# Patient Record
Sex: Female | Born: 1980 | Race: Black or African American | Hispanic: No | Marital: Married | Smoking: Former smoker
Health system: Southern US, Community
[De-identification: ages and names within clinical notes are randomized; demographics above are authoritative.]

## PROBLEM LIST (undated history)

## (undated) ENCOUNTER — Emergency Department: Discharge status: Absent without leave | Payer: Self-pay

## (undated) DIAGNOSIS — D649 Anemia, unspecified: Secondary | ICD-10-CM

## (undated) DIAGNOSIS — J45909 Unspecified asthma, uncomplicated: Secondary | ICD-10-CM

## (undated) DIAGNOSIS — T7840XA Allergy, unspecified, initial encounter: Secondary | ICD-10-CM

## (undated) HISTORY — DX: Allergy, unspecified, initial encounter: T78.40XA

## (undated) HISTORY — PX: HAND CARPECTOMY: SHX972

---
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
## 1999-02-23 ENCOUNTER — Inpatient Hospital Stay (HOSPITAL_COMMUNITY): Admission: AD | Admit: 1999-02-23 | Discharge: 1999-02-23 | Payer: Self-pay | Admitting: *Deleted

## 1999-03-19 ENCOUNTER — Inpatient Hospital Stay (HOSPITAL_COMMUNITY): Admission: AD | Admit: 1999-03-19 | Discharge: 1999-03-19 | Payer: Self-pay | Admitting: *Deleted

## 1999-03-27 ENCOUNTER — Inpatient Hospital Stay (HOSPITAL_COMMUNITY): Admission: AD | Admit: 1999-03-27 | Discharge: 1999-03-27 | Payer: Self-pay | Admitting: *Deleted

## 1999-06-26 ENCOUNTER — Inpatient Hospital Stay (HOSPITAL_COMMUNITY): Admission: AD | Admit: 1999-06-26 | Discharge: 1999-06-26 | Payer: Self-pay | Admitting: *Deleted

## 1999-06-30 ENCOUNTER — Inpatient Hospital Stay (HOSPITAL_COMMUNITY): Admission: AD | Admit: 1999-06-30 | Discharge: 1999-07-03 | Payer: Self-pay | Admitting: *Deleted

## 1999-07-26 ENCOUNTER — Inpatient Hospital Stay (HOSPITAL_COMMUNITY): Admission: AD | Admit: 1999-07-26 | Discharge: 1999-07-26 | Payer: Self-pay | Admitting: *Deleted

## 1999-10-22 ENCOUNTER — Inpatient Hospital Stay (HOSPITAL_COMMUNITY): Admission: AD | Admit: 1999-10-22 | Discharge: 1999-10-22 | Payer: Self-pay | Admitting: *Deleted

## 2000-01-24 ENCOUNTER — Inpatient Hospital Stay (HOSPITAL_COMMUNITY): Admission: AD | Admit: 2000-01-24 | Discharge: 2000-01-24 | Payer: Self-pay | Admitting: *Deleted

## 2000-05-07 ENCOUNTER — Inpatient Hospital Stay (HOSPITAL_COMMUNITY): Admission: AD | Admit: 2000-05-07 | Discharge: 2000-05-07 | Payer: Self-pay | Admitting: *Deleted

## 2011-09-04 ENCOUNTER — Other Ambulatory Visit (HOSPITAL_COMMUNITY)
Admission: RE | Admit: 2011-09-04 | Discharge: 2011-09-04 | Disposition: A | Discharge status: Home / Self Care | Payer: Managed Care, Other (non HMO) | Source: Ambulatory Visit | Attending: Obstetrics & Gynecology | Admitting: Obstetrics & Gynecology

## 2011-09-04 ENCOUNTER — Other Ambulatory Visit: Payer: Self-pay | Admitting: Obstetrics & Gynecology

## 2011-09-04 DIAGNOSIS — Z124 Encounter for screening for malignant neoplasm of cervix: Secondary | ICD-10-CM | POA: Insufficient documentation

## 2011-09-04 DIAGNOSIS — Z113 Encounter for screening for infections with a predominantly sexual mode of transmission: Secondary | ICD-10-CM | POA: Insufficient documentation

## 2011-09-04 DIAGNOSIS — Z1159 Encounter for screening for other viral diseases: Secondary | ICD-10-CM | POA: Insufficient documentation

## 2012-01-20 ENCOUNTER — Emergency Department (HOSPITAL_BASED_OUTPATIENT_CLINIC_OR_DEPARTMENT_OTHER): Payer: Managed Care, Other (non HMO)

## 2012-01-20 ENCOUNTER — Emergency Department (HOSPITAL_BASED_OUTPATIENT_CLINIC_OR_DEPARTMENT_OTHER)
Admission: EM | Admit: 2012-01-20 | Discharge: 2012-01-20 | Disposition: A | Discharge status: Home / Self Care | Payer: Managed Care, Other (non HMO) | Attending: Emergency Medicine | Admitting: Emergency Medicine

## 2012-01-20 ENCOUNTER — Encounter (HOSPITAL_BASED_OUTPATIENT_CLINIC_OR_DEPARTMENT_OTHER): Payer: Self-pay | Admitting: Family Medicine

## 2012-01-20 DIAGNOSIS — M79609 Pain in unspecified limb: Secondary | ICD-10-CM | POA: Insufficient documentation

## 2012-01-20 DIAGNOSIS — J45909 Unspecified asthma, uncomplicated: Secondary | ICD-10-CM | POA: Insufficient documentation

## 2012-01-20 DIAGNOSIS — M79671 Pain in right foot: Secondary | ICD-10-CM

## 2012-01-20 HISTORY — DX: Anemia, unspecified: D64.9

## 2012-01-20 HISTORY — DX: Unspecified asthma, uncomplicated: J45.909

## 2012-01-20 MED ORDER — IBUPROFEN 800 MG PO TABS: 800.0000 mg | ORAL_TABLET | Freq: Three times a day (TID) | ORAL | Status: DC

## 2012-01-20 MED ORDER — HYDROCODONE-ACETAMINOPHEN 5-325 MG PO TABS: 2.0000 | ORAL_TABLET | ORAL | Status: AC | PRN

## 2012-01-20 NOTE — ED Notes (Signed)
Pt c/o left foot pain without injury x 3 days. Pt sts pain is on top of foot and radiates to knee. Pain increases with ambulation.

## 2012-01-20 NOTE — ED Provider Notes (Signed)
Medical screening examination/treatment/procedure(s) were performed by non-physician practitioner and as supervising physician I was immediately available for consultation/collaboration.   Cyndra Numbers, MD 01/20/12 1728

## 2012-01-20 NOTE — ED Provider Notes (Signed)
History     CSN: [id number]  Arrival date & time 01/20/12  1478   First MD Initiated Contact with Patient 01/20/12 1215      Chief Complaint  Patient presents with  . Foot Pain    (Consider location/radiation/quality/duration/timing/severity/associated sxs/prior treatment) Patient is a 31 y.o. female presenting with lower extremity pain. The history is provided by the patient. No language interpreter was used.  Foot Pain This is a new problem. The current episode started today. The problem occurs constantly. The problem has been gradually worsening. Associated symptoms include joint swelling. Pertinent negatives include no abdominal pain. Nothing aggravates the symptoms. She has tried nothing for the symptoms.   Pt denies any injury Past Medical History  Diagnosis Date  . Asthma   . Anemia     Past Surgical History  Procedure Date  . Cesarean section     No family history on file.  History  Substance Use Topics  . Smoking status: Never Smoker   . Smokeless tobacco: Not on file  . Alcohol Use: No    OB History    Grav Para Term Preterm Abortions TAB SAB Ect Mult Living                  Review of Systems  Unable to perform ROS Gastrointestinal: Negative for abdominal pain.  Musculoskeletal: Positive for joint swelling.  All other systems reviewed and are negative.    Allergies  Review of patient's allergies indicates no known allergies.  Home Medications  No current outpatient prescriptions on file.  BP 111/64  Pulse 85  Temp 98.6 F (37 C) (Oral)  Resp 16  Ht 5\' 1"  (1.549 m)  Wt 156 lb (70.761 kg)  BMI 29.48 kg/m2  SpO2 100%  LMP 12/28/2011  Physical Exam  Nursing note and vitals reviewed. Constitutional: She appears well-developed and well-nourished.  HENT:  Head: Normocephalic and atraumatic.  Musculoskeletal: She exhibits tenderness.       Left foot nonswollen,  From,  nv and ns intact tender top of mid foot to touch  Neurological: She  is alert.  Skin: Skin is warm and dry.  Psychiatric: She has a normal mood and affect.    ED Course  Procedures (including critical care time)  Labs Reviewed - No data to display Dg Tibia/fibula Left  01/20/2012  *RADIOLOGY REPORT*  Clinical Data: Left-sided foot pain which radiates to the lower leg  LEFT TIBIA AND FIBULA - 2 VIEW  Comparison: Left foot radiographs - earlier same day  Findings:  No fracture or dislocation.  There is minimal enthesopathic change of the posterior medial aspect of the proximal tibial metaphysis. Regional soft tissues are normal.  No radiopaque foreign body. Limited visualization of the adjacent knee is normal.  IMPRESSION: No explanation for patient's lower leg pain.   Original Report Authenticated By: Waynard Reeds, M.D.    Dg Foot Complete Left  01/20/2012  *RADIOLOGY REPORT*  Clinical Data: Left-sided foot pain which radiates to lower leg, no known injury  LEFT FOOT - COMPLETE 3+ VIEW  Comparison: Left tibia and fibula radiographs - earlier same day  Findings: No fracture or dislocation.  Joint spaces are preserved. No definite erosions.  Regional soft tissues are normal.  No definite ankle joint effusion.  IMPRESSION: Normal radiographs of the left foot.   Original Report Authenticated By: Waynard Reeds, M.D.      No diagnosis found.    MDM  jone dressing post op  shoe        [name] [place], [place] 01/20/12 1243

## 2012-01-23 ENCOUNTER — Ambulatory Visit (INDEPENDENT_AMBULATORY_CARE_PROVIDER_SITE_OTHER): Payer: Managed Care, Other (non HMO) | Admitting: Family Medicine

## 2012-01-23 ENCOUNTER — Encounter: Payer: Self-pay | Admitting: Family Medicine

## 2012-01-23 VITALS — BP 106/72 | HR 85 | Ht 61.0 in | Wt 156.0 lb

## 2012-01-23 DIAGNOSIS — M79609 Pain in unspecified limb: Secondary | ICD-10-CM

## 2012-01-23 DIAGNOSIS — M79671 Pain in right foot: Secondary | ICD-10-CM

## 2012-01-23 NOTE — Patient Instructions (Addendum)
You have an extensor tendinopathy of your right foot. Cam walker when up and walking around to help rest this for next 7-10 days. Ok to come out of this twice a day for simple up/down and alphabet motion exercises. Ice 15 minutes at a time 3-4 times a day. Ibuprofen 800mg  three times a day with food for pain and inflammation. This should resolve over the next 2-3 weeks. Follow up with me in 3 weeks or as needed.

## 2012-01-27 ENCOUNTER — Encounter: Payer: Self-pay | Admitting: Family Medicine

## 2012-01-27 DIAGNOSIS — M79671 Pain in right foot: Secondary | ICD-10-CM | POA: Insufficient documentation

## 2012-01-27 NOTE — Assessment & Plan Note (Signed)
radiographs and ultrasound reassuring.  Also did not have an acute injury or repetitive load to suggest occult fracture or stress fracture.  No redness, warmth, prior history to suggest gout.  Consistent with a severe extensor tendinopathy of right foot.  Cam walker to rest area.  Icing, ibuprofen.  F/u in 3 weeks or prn.  If not improving consider MRI.

## 2012-01-27 NOTE — Progress Notes (Signed)
  Subjective:    Patient ID: Debbie Cantrell, female    DOB: Dec 03, 1980, 31 y.o.   MRN: [id number]  PCP: Dr. Neva Seat  HPI 31 yo F here for right foot pain.  Patient denies known injury. States pain started when she ran from car to the house on 9/28 - about 30 minutes later developed dorsal right foot pain. Some swelling but no bruising. No prior right foot injuries. Went to ED and had normal x-rays. Placed in bulky dressing and postop shoe. Has been taking ibuprofen, vicodin as needed. Does not run regularly for exercise.  Past Medical History  Diagnosis Date  . Asthma   . Anemia   . Allergy     Current Outpatient Prescriptions on File Prior to Visit  Medication Sig Dispense Refill  . HYDROcodone-acetaminophen (NORCO/VICODIN) 5-325 MG per tablet Take 2 tablets by mouth every 4 (four) hours as needed for pain.  16 tablet  0  . ibuprofen (ADVIL,MOTRIN) 800 MG tablet Take 1 tablet (800 mg total) by mouth 3 (three) times daily.  21 tablet  0    Past Surgical History  Procedure Date  . Cesarean section     No Known Allergies  History   Social History  . Marital Status: Single    Spouse Name: N/A    Number of Children: N/A  . Years of Education: N/A   Occupational History  . Not on file.   Social History Main Topics  . Smoking status: Never Smoker   . Smokeless tobacco: Not on file  . Alcohol Use: No  . Drug Use: No  . Sexually Active: Not on file   Other Topics Concern  . Not on file   Social History Narrative  . No narrative on file    Family History  Problem Relation Age of Onset  . Hypertension Father   . Hyperlipidemia Neg Hx   . Heart attack Neg Hx   . Diabetes Neg Hx   . Sudden death Neg Hx     BP 106/72  Pulse 85  Ht 5\' 1"  (1.549 m)  Wt 156 lb (70.761 kg)  BMI 29.48 kg/m2  LMP 12/28/2011  Review of Systems See HPI above.    Objective:   Physical Exam Gen: NAD  R foot/ankle: No gross deformity, swelling, ecchymoses. FROM ankle  with 5/5 strength all directions. TTP extensor tendons as they cross ankle, greatest across 2nd metatarsal.  No navicular, base 5th, malleolar, other TTP about foot or ankle. Negative ant drawer and talar tilt.   Negative syndesmotic compression. Thompsons test negative. NV intact distally.  MSK u/s:  No evidence cortical irregularity, neovascularity, edema overlying bony cortex of right foot.    Assessment & Plan:  1. Right foot pain - radiographs and ultrasound reassuring.  Also did not have an acute injury or repetitive load to suggest occult fracture or stress fracture.  No redness, warmth, prior history to suggest gout.  Consistent with a severe extensor tendinopathy of right foot.  Cam walker to rest area.  Icing, ibuprofen.  F/u in 3 weeks or prn.  If not improving consider MRI.

## 2015-09-08 ENCOUNTER — Emergency Department (INDEPENDENT_AMBULATORY_CARE_PROVIDER_SITE_OTHER): Payer: Managed Care, Other (non HMO)

## 2015-09-08 ENCOUNTER — Emergency Department (INDEPENDENT_AMBULATORY_CARE_PROVIDER_SITE_OTHER)
Admission: EM | Admit: 2015-09-08 | Discharge: 2015-09-08 | Disposition: A | Discharge status: Home / Self Care | Payer: Worker's Compensation | Source: Home / Self Care | Attending: Family Medicine | Admitting: Family Medicine

## 2015-09-08 DIAGNOSIS — M79641 Pain in right hand: Secondary | ICD-10-CM

## 2015-09-08 DIAGNOSIS — M25531 Pain in right wrist: Secondary | ICD-10-CM

## 2015-09-08 DIAGNOSIS — Z8669 Personal history of other diseases of the nervous system and sense organs: Secondary | ICD-10-CM | POA: Diagnosis not present

## 2015-09-08 NOTE — ED Notes (Signed)
Splint to right hand.  Notified Otilio Saberanyelle Woodard regarding treatment and limitations.

## 2015-09-08 NOTE — Discharge Instructions (Signed)
You may continue to take acetaminophen and ibuprofen for pain.  Be sure to wear your wrist splint while working to help alleviate pain.  See below for further instructions.

## 2015-09-08 NOTE — ED Notes (Signed)
Pt stated she was at work Monday morning lifting boxes and started having severe pain in the right hand.  She states that it is not continuos, but comes and goes with lifting pulling and pushing.  Pt also stated that it was swollen this am.  She has taken ibuprofen for the pain.  When hurting pain level is an 8.

## 2015-09-08 NOTE — ED Provider Notes (Signed)
CSN: [id number]     Arrival date & time 09/08/15  1004 History   First MD Initiated Contact with Patient 09/08/15 1035     Chief Complaint  Patient presents with  . Hand Pain    right   (Consider location/radiation/quality/duration/timing/severity/associated sxs/prior Treatment) HPI The pt is a 35yo female [facility] with c/o Right hand and wrist pain for about 5 days. Pain is aching and sore, severe at times. She first noticed it while lifting boxes at work.  Pain is intermittent but 8/10 at worst, worse with lifting, pulling and pushing. She is Right hand dominant. She had carpal tunnel surgery performed on her Right wrist 2 years ago by Dr. Mina MarbleWeingold and has not had any issues since this last Monday.  She has worn a wrist splint a few times and taken ibuprofen but no relief of pain. Denies known injury.  Past Medical History  Diagnosis Date  . Asthma   . Anemia   . Allergy    Past Surgical History  Procedure Laterality Date  . Cesarean section    . Hand carpectomy      right in 2015 with torn tendon   Family History  Problem Relation Age of Onset  . Hypertension Father   . Diabetes Father   . Hyperlipidemia Neg Hx   . Heart attack Neg Hx   . Sudden death Neg Hx   . Hypertension Mother   . Hypertension Brother    Social History  Substance Use Topics  . Smoking status: Never Smoker   . Smokeless tobacco: None  . Alcohol Use: No   OB History    No data available     Review of Systems  Musculoskeletal: Positive for myalgias, joint swelling and arthralgias.       Right wrist and hand  Skin: Negative for color change, rash and wound.  Neurological: Positive for weakness and numbness.    Allergies  Review of patient's allergies indicates no known allergies.  Home Medications   Prior to Admission medications   Medication Sig Start Date End Date Taking? Authorizing Provider  ibuprofen (ADVIL,MOTRIN) 800 MG tablet Take 1 tablet (800 mg total) by mouth 3  (three) times daily. 01/20/12   Elson AreasLeslie K Sofia, PA-C   Meds Ordered and Administered this Visit  Medications - No data to display  BP 105/71 mmHg  Pulse 77  Temp(Src) 98.6 F (37 C) (Oral)  Ht 5\' 2"  (1.575 m)  Wt 169 lb 12 oz (76.998 kg)  BMI 31.04 kg/m2  SpO2 100%  LMP 08/19/2015 No data found.   Physical Exam  Constitutional: She is oriented to person, place, and time. She appears well-developed and well-nourished.  HENT:  Head: Normocephalic and atraumatic.  Eyes: EOM are normal.  Neck: Normal range of motion.  Cardiovascular: Normal rate.   Pulses:      Radial pulses are 2+ on the right side.  Pulmonary/Chest: Effort normal.  Musculoskeletal: Normal range of motion. She exhibits tenderness. She exhibits no edema.  Right wrist and hand: no deformity or edema. Full ROM. 4/5 grip strength compared to Left hand. No tenderness of Right elbow, full ROM.  Neurological: She is alert and oriented to person, place, and time.  Altered sensation in fingers 2-4 on Right hand.   Skin: Skin is warm and dry.  Right wrist and hand: skin in tact. No ecchymosis or erythema. Well healed surgical scars.   Psychiatric: She has a normal mood and affect. Her behavior is  normal.  Nursing note and vitals reviewed.   ED Course  Procedures (including critical care time)  Labs Review Labs Reviewed - No data to display  Imaging Review Dg Wrist Complete Right  09/08/2015  CLINICAL DATA:  Right hand and wrist pain after lifting boxes 4 days ago. Swelling this morning. Most pain at the first through fourth MCP joints. Initial encounter. EXAM: RIGHT WRIST - COMPLETE 3+ VIEW COMPARISON:  None. FINDINGS: There is no evidence of fracture or dislocation. There is no evidence of arthropathy or other focal bone abnormality. Soft tissues are unremarkable. IMPRESSION: Negative. Electronically Signed   By: Sebastian Ache M.D.   On: 09/08/2015 11:31   Dg Hand Complete Right  09/08/2015  CLINICAL DATA:  Right  hand and wrist pain after lifting boxes 4 days ago. Swelling this morning. Most pain at the first through fourth MCP joints. Initial encounter. EXAM: RIGHT HAND - COMPLETE 3+ VIEW COMPARISON:  None. FINDINGS: There is no evidence of fracture or dislocation. There is no evidence of arthropathy or other focal bone abnormality. Soft tissues are unremarkable. IMPRESSION: Negative. Electronically Signed   By: Sebastian Ache M.D.   On: 09/08/2015 11:32      MDM   1. Right hand pain   2. Right wrist pain   3. History of carpal tunnel syndrome    Pt c/o Right hand and wrist pain. No known injury. Hx of carpal tunnel surgery 2 years ago. PMS in tact. Plain films: negative for acute injury.  Reassured pt of normal imaging. Encouraged rest, ice, compression and elevation. OTC pain medication as needed. Request pt perform light duty for 1 week. Limit pushing, pulling, lifting, carrying with Right hand. No lifting over 5 pounds with Right hand. May wear wrist splint at work. New wrist splint provided in UC.  F/u with her hand surgeon, Dr. Mina Marble, or Sports Medicine in 1-2 weeks if not improving.    Junius Finner, PA-C 09/08/15 1232

## 2017-01-03 ENCOUNTER — Encounter (HOSPITAL_BASED_OUTPATIENT_CLINIC_OR_DEPARTMENT_OTHER): Payer: Self-pay

## 2017-01-03 ENCOUNTER — Emergency Department (HOSPITAL_BASED_OUTPATIENT_CLINIC_OR_DEPARTMENT_OTHER)
Admission: EM | Admit: 2017-01-03 | Discharge: 2017-01-03 | Disposition: A | Discharge status: Home / Self Care | Payer: 59 | Attending: Emergency Medicine | Admitting: Emergency Medicine

## 2017-01-03 DIAGNOSIS — B373 Candidiasis of vulva and vagina: Secondary | ICD-10-CM | POA: Insufficient documentation

## 2017-01-03 DIAGNOSIS — J45909 Unspecified asthma, uncomplicated: Secondary | ICD-10-CM | POA: Diagnosis not present

## 2017-01-03 DIAGNOSIS — N898 Other specified noninflammatory disorders of vagina: Secondary | ICD-10-CM | POA: Diagnosis present

## 2017-01-03 DIAGNOSIS — B3731 Acute candidiasis of vulva and vagina: Secondary | ICD-10-CM

## 2017-01-03 LAB — URINALYSIS, ROUTINE W REFLEX MICROSCOPIC
Bilirubin Urine: NEGATIVE
GLUCOSE, UA: NEGATIVE mg/dL
Hgb urine dipstick: NEGATIVE
Ketones, ur: NEGATIVE mg/dL
Nitrite: NEGATIVE
PH: 6 (ref 5.0–8.0)
Protein, ur: NEGATIVE mg/dL
Specific Gravity, Urine: 1.02 (ref 1.005–1.030)

## 2017-01-03 LAB — URINALYSIS, MICROSCOPIC (REFLEX): RBC / HPF: NONE SEEN RBC/hpf (ref 0–5)

## 2017-01-03 LAB — WET PREP, GENITAL
SPERM: NONE SEEN
TRICH WET PREP: NONE SEEN

## 2017-01-03 LAB — PREGNANCY, URINE: Preg Test, Ur: NEGATIVE

## 2017-01-03 MED ORDER — FLUCONAZOLE 150 MG PO TABS: 150.0000 mg | ORAL_TABLET | Freq: Every day | ORAL | 0 refills | Status: DC

## 2017-01-03 MED ORDER — FLUCONAZOLE 50 MG PO TABS
150.0000 mg | ORAL_TABLET | Freq: Once | ORAL | Status: AC
  Administered 2017-01-03: 150 mg via ORAL
  Filled 2017-01-03: qty 1

## 2017-01-03 NOTE — ED Triage Notes (Signed)
C/o vaginal d/c x 2 days-NAD-steady gait 

## 2017-01-03 NOTE — ED Provider Notes (Signed)
[facility] Note   CSN: [id number] Arrival date & time: 01/03/17  1548     History   Chief Complaint Chief Complaint  Patient presents with  . Vaginal Discharge    HPI Debbie Cantrell is a 36 y.o. female presenting with vaginal discharge that is thick and white. She has irritation around her labia and itching. It burns after she urinates. She denies fevers, chills, pelvic pain, nausea, vomiting. She reports getting frequent yeast infections about every 3 months. Usually will use OTC medication to treat. She is monogamous, married. Has her tubes tied. She has no primary care provider.   HPI  Past Medical History:  Diagnosis Date  . Allergy   . Anemia   . Asthma     Patient Active Problem List   Diagnosis Date Noted  . Right foot pain 01/27/2012    Past Surgical History:  Procedure Laterality Date  . CESAREAN SECTION    . HAND CARPECTOMY     right in 2015 with torn tendon    OB History    No data available       Home Medications    Prior to Admission medications   Medication Sig Start Date End Date Taking? Authorizing Provider  fluconazole (DIFLUCAN) 150 MG tablet Take 1 tablet (150 mg total) by mouth daily. 01/03/17   Tillman Sers, DO    Family History Family History  Problem Relation Age of Onset  . Hypertension Father   . Diabetes Father   . Hypertension Mother   . Hypertension Brother   . Hyperlipidemia Neg Hx   . Heart attack Neg Hx   . Sudden death Neg Hx     Social History Social History  Substance Use Topics  . Smoking status: Never Smoker  . Smokeless tobacco: Never Used  . Alcohol use No     Allergies   Patient has no known allergies.   Review of Systems Review of Systems  Constitutional: Negative for activity change, appetite change, chills and fever.  HENT: Negative for congestion, sinus pain and sore throat.   Respiratory: Negative for cough and shortness of breath.   Cardiovascular: Negative for  chest pain.  Gastrointestinal: Negative for abdominal pain, constipation, diarrhea, nausea and vomiting.  Genitourinary: Positive for vaginal discharge and vaginal pain. Negative for decreased urine volume, difficulty urinating, dyspareunia, dysuria, frequency, hematuria, pelvic pain and urgency.  Skin: Negative for rash.  Neurological: Negative for weakness and headaches.     Physical Exam Updated Vital Signs BP 113/65 (BP Location: Left Arm)   Pulse 70   Temp 98.5 F (36.9 C) (Oral)   Resp 18   Ht  (1.575 m)   Wt 75.4 kg (166 lb 3.6 oz)   LMP 12/07/2016   SpO2 100%   BMI 30.40 kg/m   Physical Exam  Constitutional: She appears well-developed and well-nourished. No distress.  HENT:  Head: Normocephalic and atraumatic.  Neck: Normal range of motion.  Cardiovascular: Normal rate and regular rhythm.   No murmur heard. Pulmonary/Chest: Effort normal and breath sounds normal. No respiratory distress.  Abdominal: Soft. She exhibits no mass. There is no tenderness.  Genitourinary: Cervix exhibits no motion tenderness. There is erythema in the vagina. No bleeding in the vagina. Vaginal discharge found.  Musculoskeletal: Normal range of motion. She exhibits no deformity.  Neurological: She is alert. She exhibits normal muscle tone.  Skin: Skin is warm and dry. No rash noted.  Psychiatric: She has a normal  mood and affect.     ED Treatments / Results  Labs (all labs ordered are listed, but only abnormal results are displayed) Labs Reviewed  WET PREP, GENITAL - Abnormal; Notable for the following:       Result Value   Yeast Wet Prep HPF POC PRESENT (*)    Clue Cells Wet Prep HPF POC PRESENT (*)    WBC, Wet Prep HPF POC FEW (*)    All other components within normal limits  URINALYSIS, ROUTINE W REFLEX MICROSCOPIC - Abnormal; Notable for the following:    Leukocytes, UA TRACE (*)    All other components within normal limits  URINALYSIS, MICROSCOPIC (REFLEX) - Abnormal;  Notable for the following:    Bacteria, UA RARE (*)    Squamous Epithelial / LPF 0-5 (*)    All other components within normal limits  PREGNANCY, URINE  GC/CHLAMYDIA PROBE AMP ([facility]) NOT AT [facility]    EKG  EKG Interpretation None       Radiology No results found.  Procedures Procedures (including critical care time)  Medications Ordered in ED Medications  fluconazole (DIFLUCAN) tablet 150 mg (not administered)     Initial Impression / Assessment and Plan / ED Course  I have reviewed the triage vital signs and the nursing notes.  Pertinent labs & imaging results that were available during my care of the patient were reviewed by me and considered in my medical decision making (see chart for details).     35 year old female presenting with vaginal discharge. Upreg negative, UA unremarkable. Wet prep demonstrates yeast and clue cells. Patient with recurrent yeast infections. Treated with  fluconazole in ED and rx given for repeat dose in 3 days if not improved. Recommend outpatient follow up for further treatment of recurrent infections. Patient verbalized understanding and agreement with plan.   Final Clinical Impressions(s) / ED Diagnoses   Final diagnoses:  Vaginal yeast infection    New Prescriptions New Prescriptions   FLUCONAZOLE (DIFLUCAN) 150 MG TABLET    Take 1 tablet (150 mg total) by mouth daily.   Debbie Patty, DO PGY-2, Debbie Cantrell 01/03/2017 4:39 PM    Tillman Sers, DO 01/03/17 1640    Pricilla Loveless, MD 01/03/17 [phone]

## 2017-01-06 LAB — GC/CHLAMYDIA PROBE AMP (~~LOC~~) NOT AT ARMC
Chlamydia: NEGATIVE
NEISSERIA GONORRHEA: NEGATIVE

## 2017-04-07 IMAGING — DX DG WRIST COMPLETE 3+V*R*
4 series · 4 of 4 positions shown · non-contrast
Comparison: None.

CLINICAL DATA: Right hand and wrist pain after lifting boxes 4 days
ago. Swelling this morning. Most pain at the first through fourth
MCP joints. Initial encounter.

EXAM:
RIGHT WRIST - COMPLETE 3+ VIEW

[wrist pa]
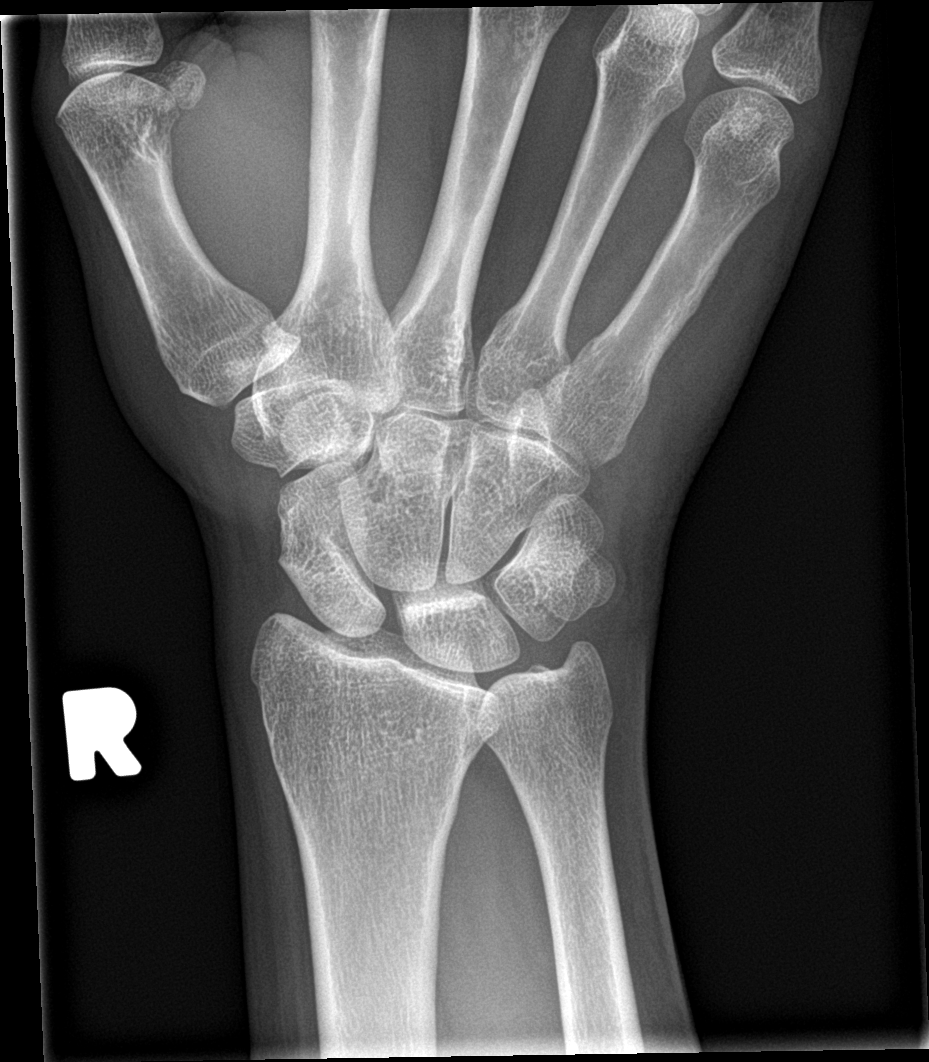

[wrist obl]
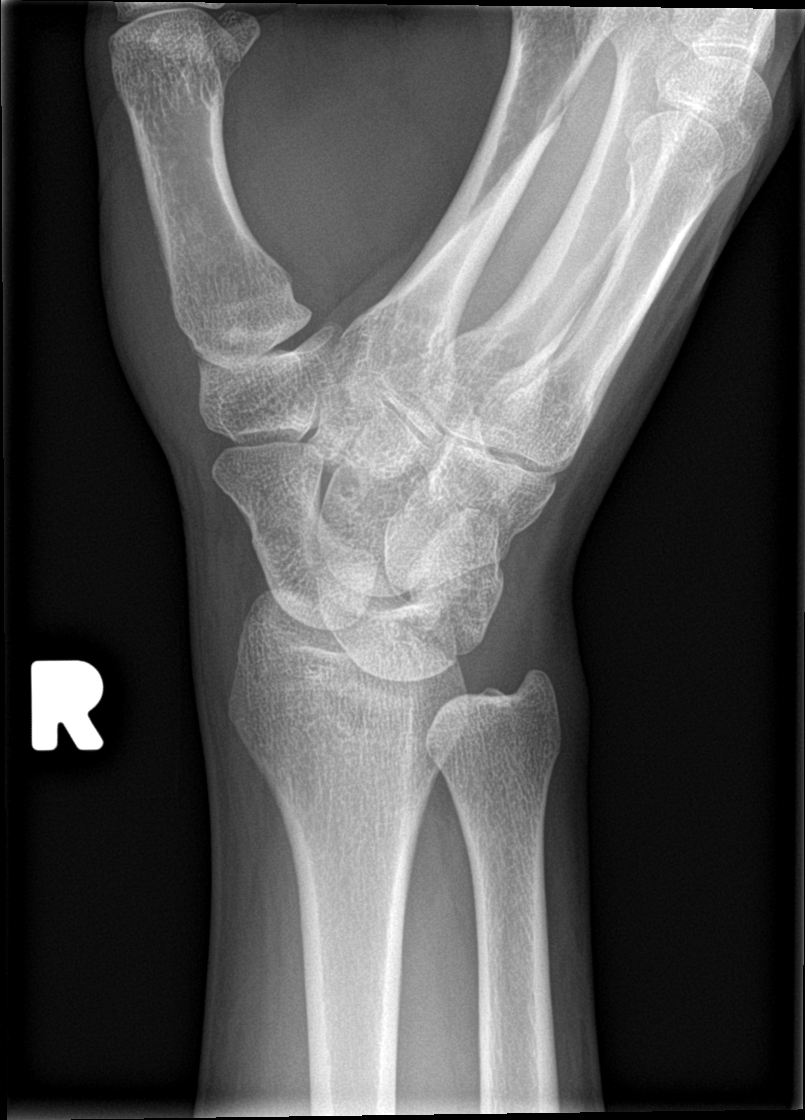

[wrist lat]
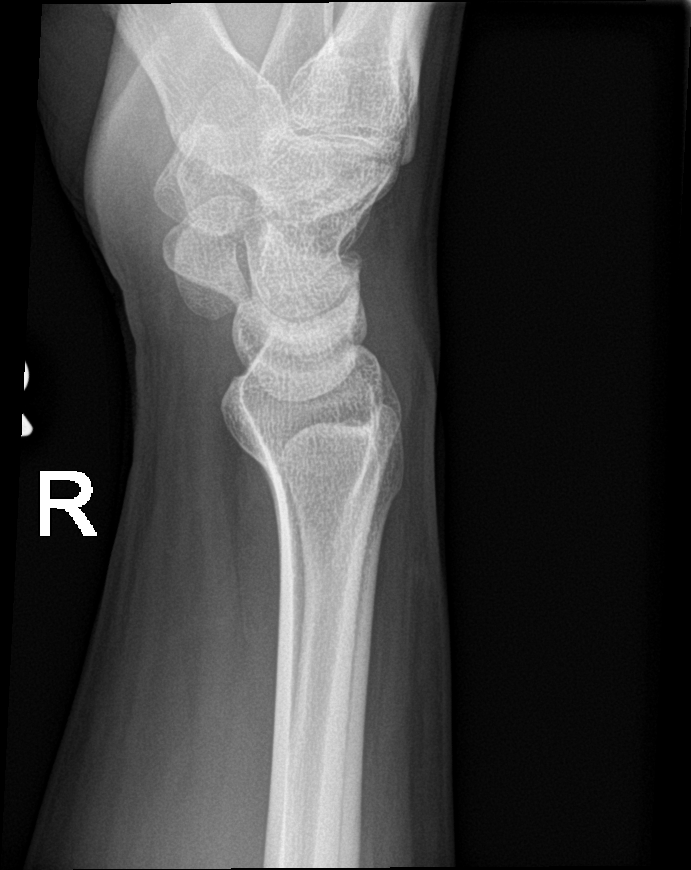

[wrist navicular]
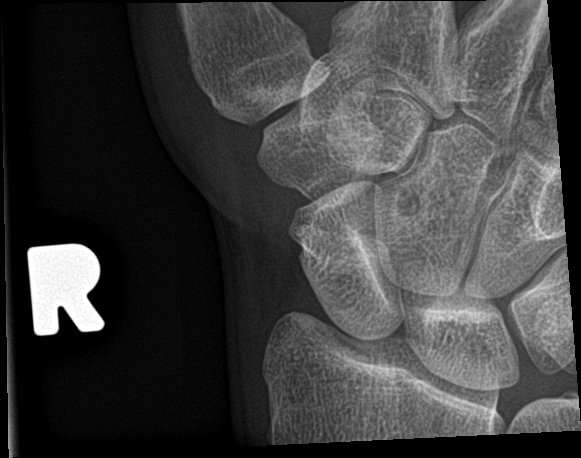

[4 of 4 positions shown; findings below may reference images not displayed]

FINDINGS: There is no evidence of fracture or dislocation. There is no
evidence of arthropathy or other focal bone abnormality. Soft
tissues are unremarkable.
IMPRESSION: Negative.

## 2017-04-07 IMAGING — DX DG HAND COMPLETE 3+V*R*
3 series · 3 of 3 positions shown · non-contrast
Comparison: None.

CLINICAL DATA: Right hand and wrist pain after lifting boxes 4 days
ago. Swelling this morning. Most pain at the first through fourth
MCP joints. Initial encounter.

EXAM:
RIGHT HAND - COMPLETE 3+ VIEW

[hand pa]
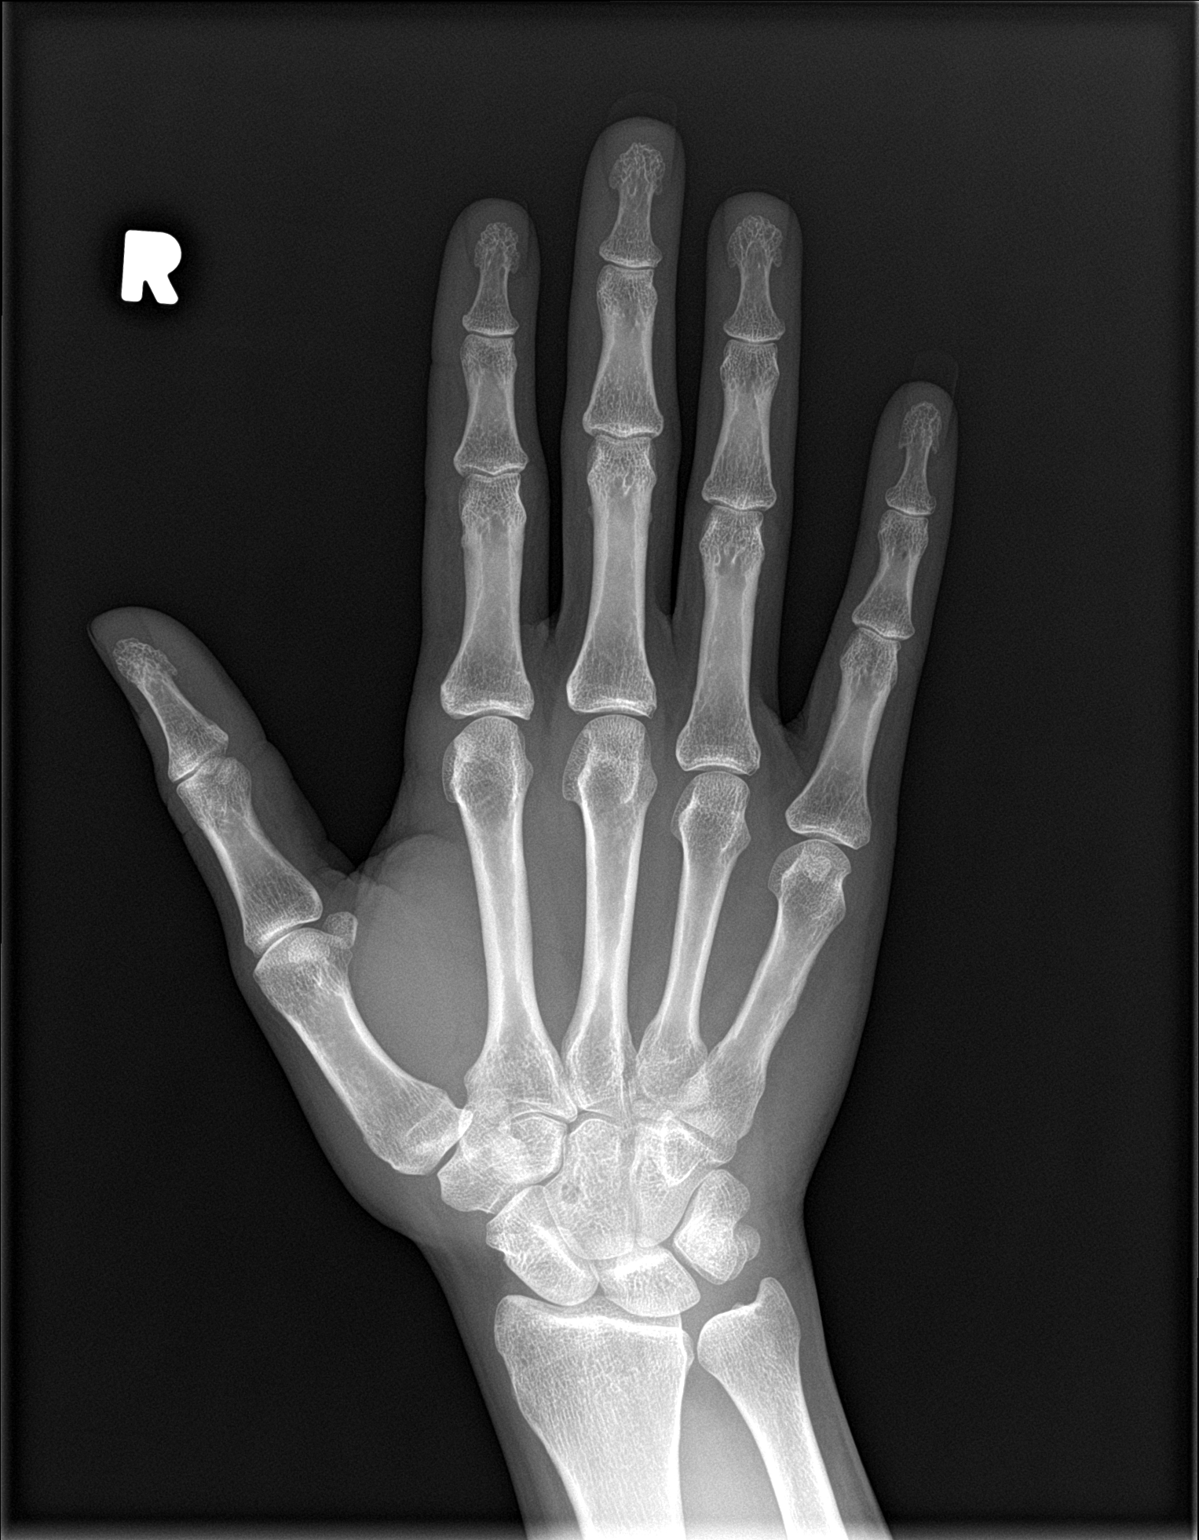

[hand obl]
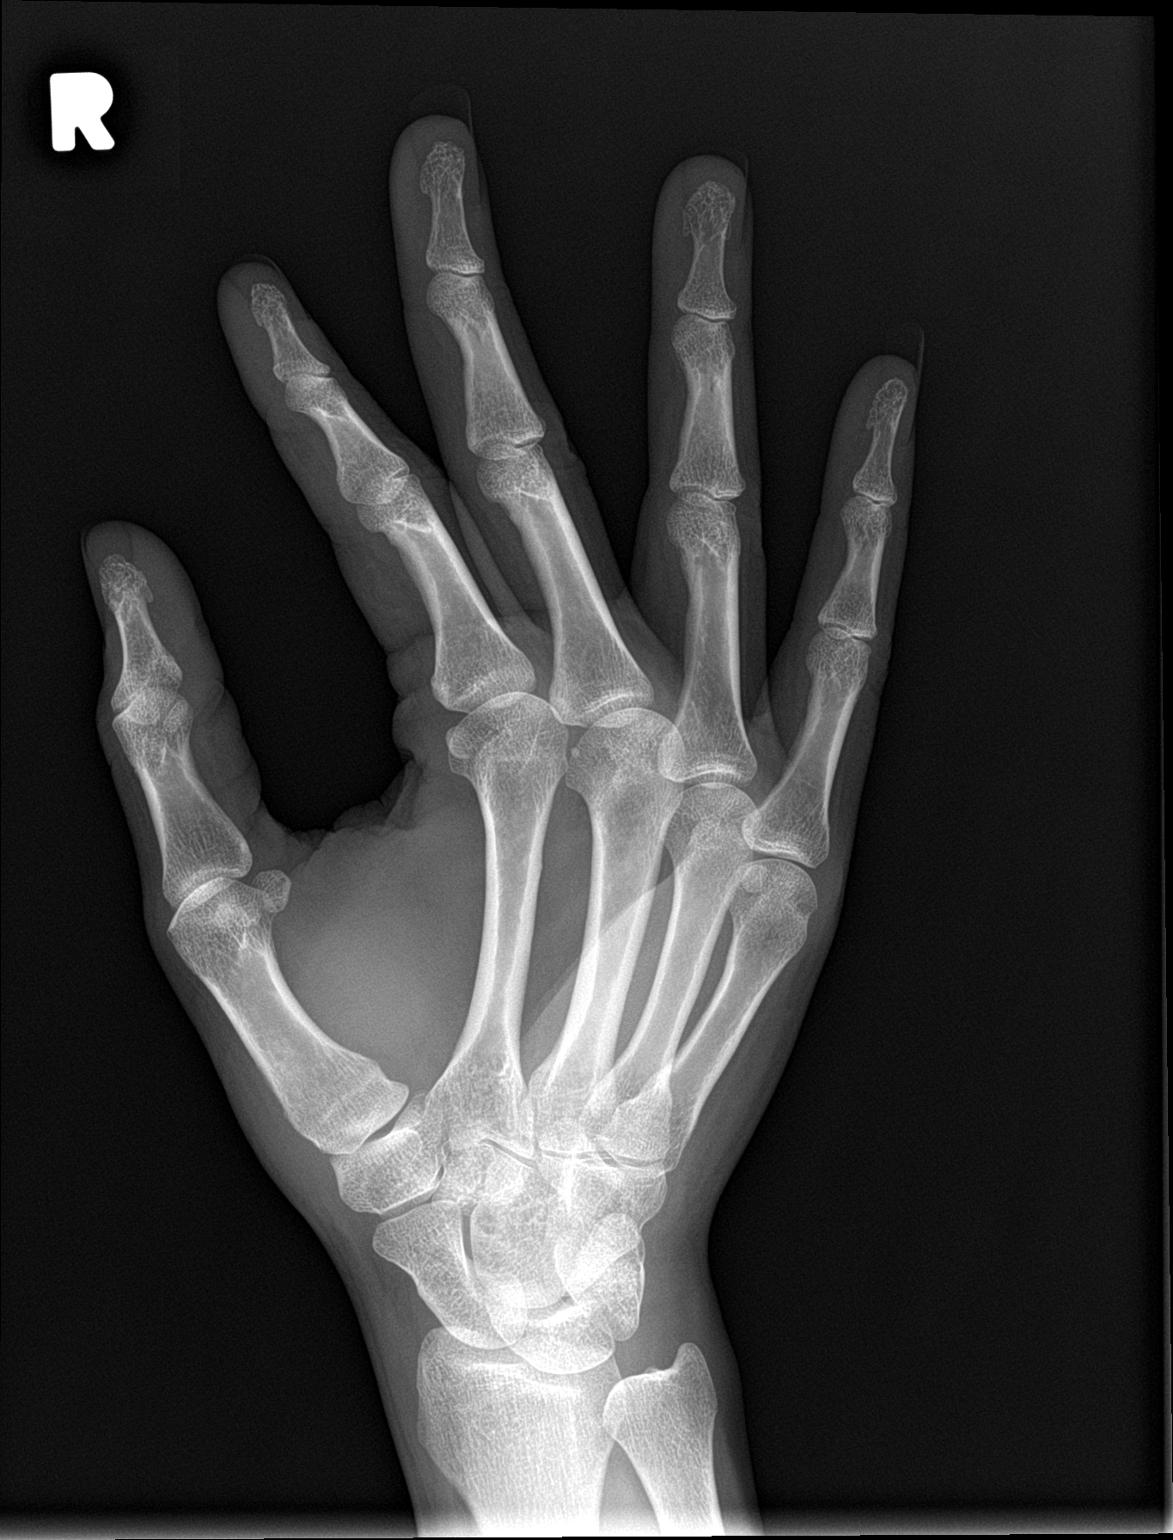

[hand lat]
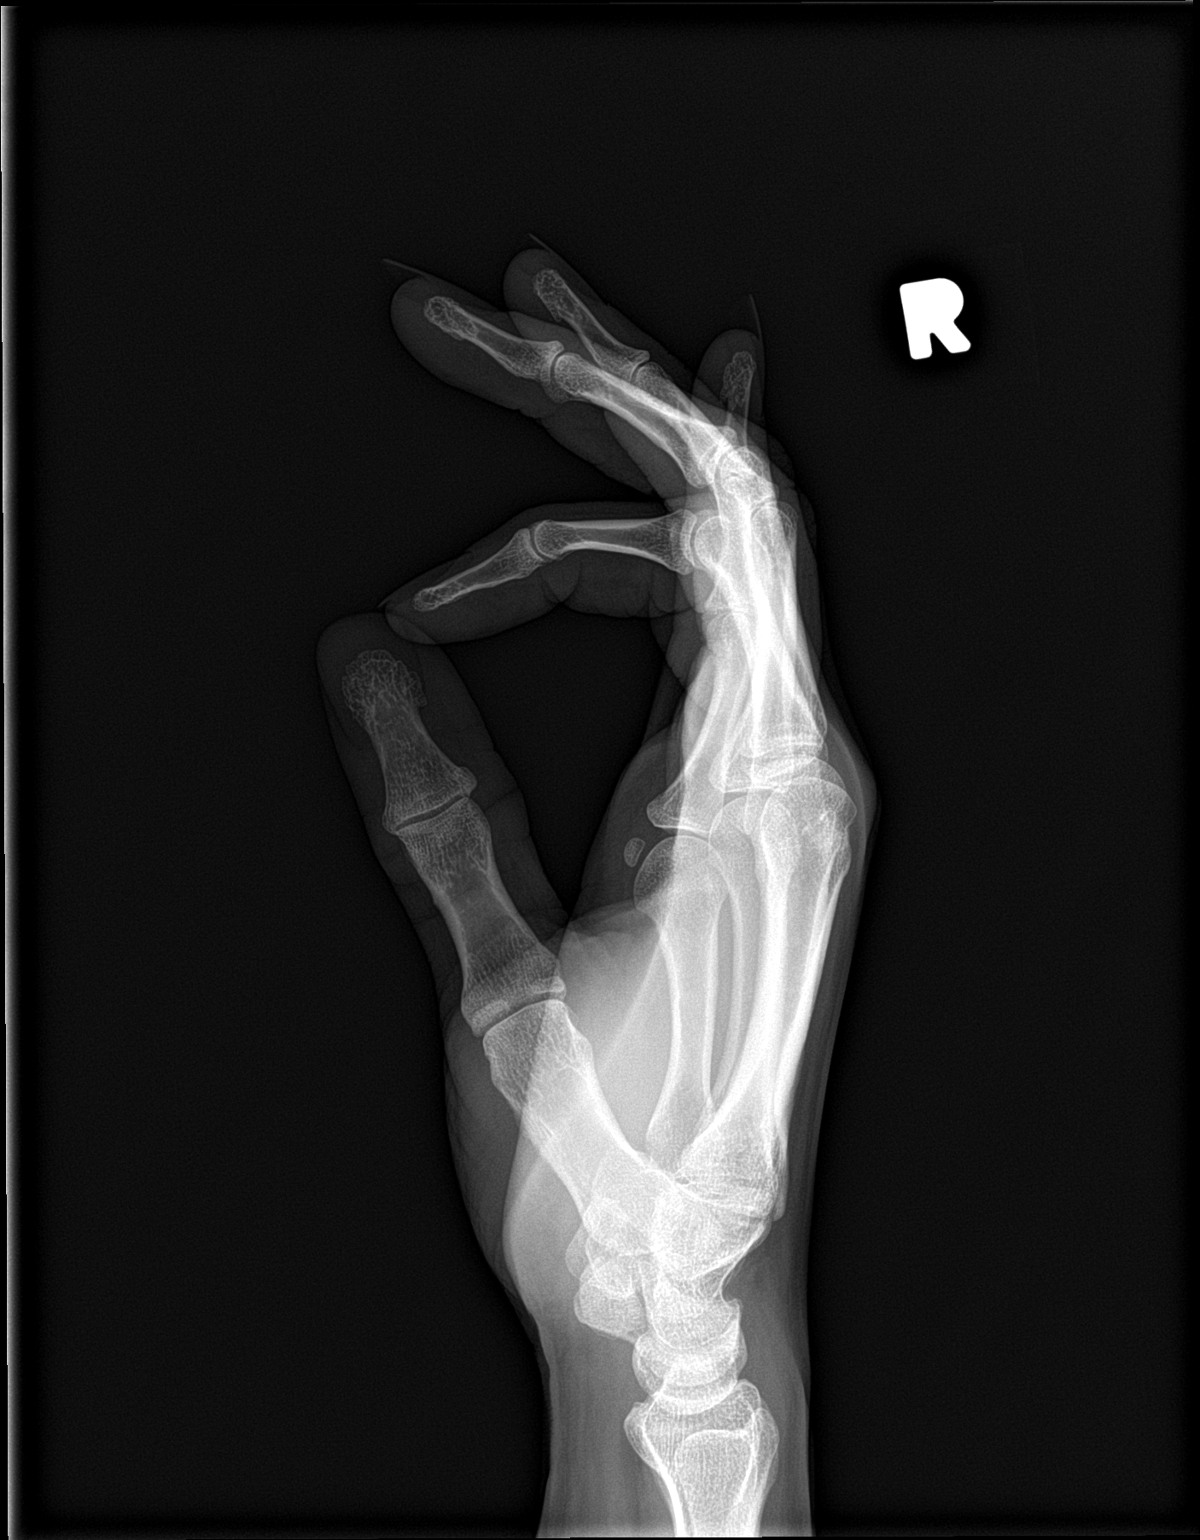

[3 of 3 positions shown; findings below may reference images not displayed]

FINDINGS: There is no evidence of fracture or dislocation. There is no
evidence of arthropathy or other focal bone abnormality. Soft
tissues are unremarkable.
IMPRESSION: Negative.

## 2018-05-11 ENCOUNTER — Encounter (HOSPITAL_COMMUNITY): Payer: Self-pay | Admitting: Family Medicine

## 2018-05-11 ENCOUNTER — Emergency Department (HOSPITAL_COMMUNITY)
Admission: EM | Admit: 2018-05-11 | Discharge: 2018-05-11 | Disposition: A | Discharge status: Home / Self Care | Payer: 59 | Attending: Emergency Medicine | Admitting: Emergency Medicine

## 2018-05-11 DIAGNOSIS — R55 Syncope and collapse: Secondary | ICD-10-CM | POA: Diagnosis not present

## 2018-05-11 DIAGNOSIS — Z79899 Other long term (current) drug therapy: Secondary | ICD-10-CM | POA: Diagnosis not present

## 2018-05-11 DIAGNOSIS — J45909 Unspecified asthma, uncomplicated: Secondary | ICD-10-CM | POA: Diagnosis not present

## 2018-05-11 DIAGNOSIS — R531 Weakness: Secondary | ICD-10-CM | POA: Diagnosis not present

## 2018-05-11 DIAGNOSIS — Z87891 Personal history of nicotine dependence: Secondary | ICD-10-CM | POA: Diagnosis not present

## 2018-05-11 LAB — BASIC METABOLIC PANEL
ANION GAP: 10 (ref 5–15)
BUN: 16 mg/dL (ref 6–20)
CALCIUM: 9.2 mg/dL (ref 8.9–10.3)
CO2: 21 mmol/L — AB (ref 22–32)
CREATININE: 0.98 mg/dL (ref 0.44–1.00)
Chloride: 107 mmol/L (ref 98–111)
GFR calc Af Amer: >60 mL/min (ref >60)
Glucose, Bld: 94 mg/dL (ref 70–99)
Potassium: 3.6 mmol/L (ref 3.5–5.1)
Sodium: 138 mmol/L (ref 135–145)

## 2018-05-11 LAB — CBC WITH DIFFERENTIAL/PLATELET
Abs Immature Granulocytes: 0.03 10*3/uL (ref 0.00–0.07)
BASOS PCT: 0 %
Basophils Absolute: 0 10*3/uL (ref 0.0–0.1)
EOS ABS: 0 10*3/uL (ref 0.0–0.5)
EOS PCT: 0 %
HCT: 34.1 % — ABNORMAL LOW (ref 36.0–46.0)
Hemoglobin: 10.2 g/dL — ABNORMAL LOW (ref 12.0–15.0)
IMMATURE GRANULOCYTES: 0 %
Lymphocytes Relative: 18 %
Lymphs Abs: 1.6 10*3/uL (ref 0.7–4.0)
MCH: 25.6 pg — AB (ref 26.0–34.0)
MCHC: 29.9 g/dL — AB (ref 30.0–36.0)
MCV: 85.5 fL (ref 80.0–100.0)
MONO ABS: 0.3 10*3/uL (ref 0.1–1.0)
MONOS PCT: 4 %
NEUTROS PCT: 78 %
Neutro Abs: 6.9 10*3/uL (ref 1.7–7.7)
Platelets: 292 10*3/uL (ref 150–400)
RBC: 3.99 MIL/uL (ref 3.87–5.11)
RDW: 15.2 % (ref 11.5–15.5)
WBC: 8.8 10*3/uL (ref 4.0–10.5)
nRBC: 0 % (ref 0.0–0.2)

## 2018-05-11 LAB — I-STAT BETA HCG BLOOD, ED (MC, WL, AP ONLY): I-stat hCG, quantitative: <5 m[IU]/mL (ref <5)

## 2018-05-11 LAB — TSH: TSH: 0.396 u[IU]/mL (ref 0.350–4.500)

## 2018-05-11 NOTE — Discharge Instructions (Addendum)
Drink plenty of fluids and get plenty of rest. ° °Return to the emergency department if symptoms significantly worsen or change. °

## 2018-05-11 NOTE — ED Triage Notes (Signed)
Patient was at the [facility] [employer] and transported via [facility]. Patient drank a "C4" earlier today with not much to eat today. 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 She denies chest pain, shortness of breath, nausea, vomiting, diarrhea, and dizziness. She thinks this tiredness could be related to her anemia.

## 2018-05-11 NOTE — ED Provider Notes (Signed)
[facility]-EMERGENCY DEPT Provider Note   CSN: [id number] Arrival date & time: 05/11/18  4403     History   Chief Complaint Chief Complaint  Patient presents with  . Fatigue    HPI Debbie Cantrell is a 38 y.o. female.  Patient is a 38 year old female with past medical history of asthma and anemia.  She presents today with complaints of weakness and fatigue.  She reports doing a Zumba class early this afternoon.  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  She had to pull the car over and rest.  She went to a local grocery store where EMS was called and she was transported here.  She did feel as though her heart was racing somewhat during this episode, but denies this now.  She denies any chest pain, difficulty breathing.  She denies any fevers, chills, or other preceding illnesses.  The history is provided by the patient.    Past Medical History:  Diagnosis Date  . Allergy   . Anemia   . Asthma     Patient Active Problem List   Diagnosis Date Noted  . Right foot pain 01/27/2012    Past Surgical History:  Procedure Laterality Date  . CESAREAN SECTION    . HAND CARPECTOMY     right in 2015 with torn tendon     OB History   No obstetric history on file.      Home Medications    Prior to Admission medications   Medication Sig Start Date End Date Taking? Authorizing Provider  fluconazole (DIFLUCAN) 150 MG tablet Take 1 tablet (150 mg total) by mouth daily. 01/03/17   Tillman Sers, DO    Family History Family History  Problem Relation Age of Onset  . Hypertension Father   . Diabetes Father   . Hypertension Mother   . Hypertension Brother   . Hyperlipidemia Neg Hx   . Heart attack Neg Hx   . Sudden death Neg Hx     Social History Social History   Tobacco Use  . Smoking status: Former Games developer  . Smokeless tobacco: Never Used  Substance Use Topics  . Alcohol use: No  . Drug use: No      Allergies   Patient has no known allergies.   Review of Systems Review of Systems  All other systems reviewed and are negative.    Physical Exam Updated Vital Signs BP 110/73 (BP Location: Right Arm)   Pulse 84   Temp 97.9 F (36.6 C) (Oral)   Resp 16   Ht 5\' 2"  (1.575 m)   Wt 74.8 kg   LMP 04/20/2018   SpO2 100%   BMI 30.18 kg/m   Physical Exam Vitals signs and nursing note reviewed.  Constitutional:      General: She is not in acute distress.    Appearance: She is well-developed. She is not diaphoretic.  HENT:     Head: Normocephalic and atraumatic.  Eyes:     Extraocular Movements: Extraocular movements intact.     Pupils: Pupils are equal, round, and reactive to light.  Neck:     Musculoskeletal: Normal range of motion and neck supple.  Cardiovascular:     Rate and Rhythm: Normal rate and regular rhythm.     Heart sounds: No murmur. No friction rub. No gallop.   Pulmonary:     Effort: Pulmonary effort is normal. No respiratory distress.  Breath sounds: Normal breath sounds. No wheezing.  Abdominal:     General: Bowel sounds are normal. There is no distension.     Palpations: Abdomen is soft.     Tenderness: There is no abdominal tenderness.  Musculoskeletal: Normal range of motion.  Skin:    General: Skin is warm and dry.  Neurological:     General: No focal deficit present.     Mental Status: She is alert and oriented to person, place, and time.     Cranial Nerves: No cranial nerve deficit.     Coordination: Coordination normal.  Psychiatric:        Mood and Affect: Mood normal.        Behavior: Behavior normal.      ED Treatments / Results  Labs (all labs ordered are listed, but only abnormal results are displayed) Labs Reviewed  BASIC METABOLIC PANEL  CBC WITH DIFFERENTIAL/PLATELET  TSH  I-STAT BETA HCG BLOOD, ED ([facility], [facility], [facility] ONLY)    EKG EKG Interpretation  Date/Time:  Monday May 11 2018 20:33:00 EST Ventricular Rate:   79 PR Interval:    QRS Duration: 78 QT Interval:  397 QTC Calculation: 456 R Axis:   82 Text Interpretation:  Sinus rhythm RAE, consider biatrial enlargement Baseline wander in lead(s) V3 Confirmed by Geoffery Lyons ([id number]) on 05/11/2018 8:48:39 PM   Radiology No results found.  Procedures Procedures (including critical care time)  Medications Ordered in ED Medications - No data to display   Initial Impression / Assessment and Plan / ED Course  I have reviewed the triage vital signs and the nursing notes.  Pertinent labs & imaging results that were available during my care of the patient were reviewed by me and considered in my medical decision making (see chart for details).  Patient presents here with complaints of fatigue and near syncope that started just prior to arrival.  This occurred after a Zumba class.  Her work-up is unremarkable with the exception of a hemoglobin of 10.2.  She tells me that she is chronically anemic and this is baseline for her.  Vitals are stable and work-up is otherwise unremarkable.  I see no indication for any further work-up at this time.  She will be discharged with advised to take a multivitamin with iron and return as needed for any problems.  Final Clinical Impressions(s) / ED Diagnoses   Final diagnoses:  None    ED Discharge Orders    None       Geoffery Lyons, MD 05/11/18 2243

## 2018-06-10 ENCOUNTER — Ambulatory Visit: Payer: 59 | Admitting: Family Medicine

## 2018-09-05 ENCOUNTER — Encounter (HOSPITAL_BASED_OUTPATIENT_CLINIC_OR_DEPARTMENT_OTHER): Payer: Self-pay | Admitting: Emergency Medicine

## 2018-09-05 ENCOUNTER — Emergency Department (HOSPITAL_BASED_OUTPATIENT_CLINIC_OR_DEPARTMENT_OTHER)
Admission: EM | Admit: 2018-09-05 | Discharge: 2018-09-05 | Disposition: A | Discharge status: Home / Self Care | Payer: 59 | Attending: Emergency Medicine | Admitting: Emergency Medicine

## 2018-09-05 ENCOUNTER — Other Ambulatory Visit: Payer: Self-pay

## 2018-09-05 DIAGNOSIS — N939 Abnormal uterine and vaginal bleeding, unspecified: Secondary | ICD-10-CM | POA: Diagnosis not present

## 2018-09-05 DIAGNOSIS — J45909 Unspecified asthma, uncomplicated: Secondary | ICD-10-CM | POA: Insufficient documentation

## 2018-09-05 DIAGNOSIS — Z87891 Personal history of nicotine dependence: Secondary | ICD-10-CM | POA: Insufficient documentation

## 2018-09-05 LAB — CBC WITH DIFFERENTIAL/PLATELET
Abs Immature Granulocytes: 0.01 10*3/uL (ref 0.00–0.07)
Basophils Absolute: 0.1 10*3/uL (ref 0.0–0.1)
Basophils Relative: 1 %
Eosinophils Absolute: 0 10*3/uL (ref 0.0–0.5)
Eosinophils Relative: 1 %
HCT: 35 % — ABNORMAL LOW (ref 36.0–46.0)
Hemoglobin: 10.6 g/dL — ABNORMAL LOW (ref 12.0–15.0)
Immature Granulocytes: 0 %
Lymphocytes Relative: 40 %
Lymphs Abs: 1.7 10*3/uL (ref 0.7–4.0)
MCH: 25.7 pg — ABNORMAL LOW (ref 26.0–34.0)
MCHC: 30.3 g/dL (ref 30.0–36.0)
MCV: 85 fL (ref 80.0–100.0)
Monocytes Absolute: 0.2 10*3/uL (ref 0.1–1.0)
Monocytes Relative: 6 %
Neutro Abs: 2.2 10*3/uL (ref 1.7–7.7)
Neutrophils Relative %: 52 %
Platelets: 306 10*3/uL (ref 150–400)
RBC: 4.12 MIL/uL (ref 3.87–5.11)
RDW: 15.6 % — ABNORMAL HIGH (ref 11.5–15.5)
WBC: 4.2 10*3/uL (ref 4.0–10.5)
nRBC: 0 % (ref 0.0–0.2)

## 2018-09-05 LAB — WET PREP, GENITAL
Clue Cells Wet Prep HPF POC: NONE SEEN
Sperm: NONE SEEN
Trich, Wet Prep: NONE SEEN
Yeast Wet Prep HPF POC: NONE SEEN

## 2018-09-05 LAB — PREGNANCY, URINE: Preg Test, Ur: NEGATIVE

## 2018-09-05 NOTE — ED Provider Notes (Signed)
[facility] EMERGENCY DEPARTMENT Provider Note   CSN: [id number] Arrival date & time: 09/05/18  1250    History   Chief Complaint Chief Complaint  Patient presents with  . Vaginal Bleeding    HPI Debbie Cantrell is a 38 y.o. female.     38yo female with complaint of intermittent vaginal bleeding x 2 weeks, LMP 3 weeks ago. Patient reports calling her GYN at [facility] but has not received call back. Reports bleeding only with wiping at this point, may have passed a clot while in the shower today. History of anemia, does not take iron supplement, denies weakness, dizziness, no other complaints or concerns.      Past Medical History:  Diagnosis Date  . Allergy   . Anemia   . Asthma     Patient Active Problem List   Diagnosis Date Noted  . Right foot pain 01/27/2012    Past Surgical History:  Procedure Laterality Date  . CESAREAN SECTION    . HAND CARPECTOMY     right in 2015 with torn tendon     OB History   No obstetric history on file.      Home Medications    Prior to Admission medications   Not on File    Family History Family History  Problem Relation Age of Onset  . Hypertension Father   . Diabetes Father   . Hypertension Mother   . Hypertension Brother   . Hyperlipidemia Neg Hx   . Heart attack Neg Hx   . Sudden death Neg Hx     Social History Social History   Tobacco Use  . Smoking status: Former Games developer  . Smokeless tobacco: Never Used  Substance Use Topics  . Alcohol use: No  . Drug use: No     Allergies   Patient has no known allergies.   Review of Systems Review of Systems  Constitutional: Negative for fever.  Respiratory: Negative for shortness of breath.   Gastrointestinal: Negative for abdominal pain.  Genitourinary: Positive for vaginal bleeding. Negative for vaginal discharge and vaginal pain.  Skin: Negative for rash and wound.  Allergic/Immunologic: Negative for immunocompromised state.   Neurological: Negative for dizziness and weakness.  Hematological: Does not bruise/bleed easily.  All other systems reviewed and are negative.    Physical Exam Updated Vital Signs BP 126/82 (BP Location: Right Arm)   Pulse 60   Temp 98.6 F (37 C) (Oral)   Resp 16   Ht 5\' 2"  (1.575 m)   Wt 73.5 kg   SpO2 98%   BMI 29.63 kg/m   Physical Exam Vitals signs and nursing note reviewed. Exam conducted with a chaperone present.  Constitutional:      General: She is not in acute distress.    Appearance: She is well-developed. She is not diaphoretic.  HENT:     Head: Normocephalic and atraumatic.  Cardiovascular:     Rate and Rhythm: Normal rate and regular rhythm.     Pulses: Normal pulses.     Heart sounds: Normal heart sounds.  Pulmonary:     Effort: Pulmonary effort is normal.     Breath sounds: Normal breath sounds.  Abdominal:     Palpations: Abdomen is soft.     Tenderness: There is no abdominal tenderness.  Genitourinary:    Cervix: Cervical bleeding present. No friability.     Comments: Small amount of blood in vaginal vault  Skin:    General: Skin is warm and  dry.     Coloration: Skin is not pale.  Neurological:     Mental Status: She is alert and oriented to person, place, and time.  Psychiatric:        Behavior: Behavior normal.      ED Treatments / Results  Labs (all labs ordered are listed, but only abnormal results are displayed) Labs Reviewed  WET PREP, GENITAL - Abnormal; Notable for the following components:      Result Value   WBC, Wet Prep HPF POC MODERATE (*)    All other components within normal limits  CBC WITH DIFFERENTIAL/PLATELET - Abnormal; Notable for the following components:   Hemoglobin 10.6 (*)    HCT 35.0 (*)    MCH 25.7 (*)    RDW 15.6 (*)    All other components within normal limits  PREGNANCY, URINE  GC/CHLAMYDIA PROBE AMP ([place]    EKG None  Radiology No results found.  Procedures Procedures  (including critical care time)  Medications Ordered in ED Medications - No data to display   Initial Impression / Assessment and Plan / ED Course  I have reviewed the triage vital signs and the nursing notes.  Pertinent labs & imaging results that were available during my care of the patient were reviewed by me and considered in my medical decision making (see chart for details).  Clinical Course as of Sep 04 1501  Sat Sep 05, 2018  1501 37yo female with vaginal bleeding, intermittent, x 2 weeks. On exam, mild bleeding, vitals stable, CBC with unchanged anemia, hgb 10.6, preg negative. Patient has not been able to get in touch with her GYN, given referral to local GYN for follow up. Advised to take prenatal vitamin with iron.   [LM]    Clinical Course User Index [LM] Jeannie FendMurphy, Laura A, PA-C      Final Clinical Impressions(s) / ED Diagnoses   Final diagnoses:  Vaginal bleeding    ED Discharge Orders    None       Jeannie FendMurphy, Laura A, PA-C 09/05/18 1503    Virgina Norfolkuratolo, Adam, DO 09/06/18 [phone]

## 2018-09-05 NOTE — Discharge Instructions (Signed)
Follow up with GYN, referral given. Take a multivitamin with iron daily. Return to ER for severe/heavy bleeding- bleeding through a heavy pad every 1-2 hours.

## 2018-09-05 NOTE — ED Triage Notes (Signed)
Vaginal bleeding x 2 weeks

## 2018-09-07 LAB — GC/CHLAMYDIA PROBE AMP (~~LOC~~) NOT AT ARMC
Chlamydia: NEGATIVE
Neisseria Gonorrhea: NEGATIVE

## 2019-11-15 ENCOUNTER — Other Ambulatory Visit: Payer: Self-pay

## 2019-11-15 ENCOUNTER — Emergency Department
Admission: EM | Admit: 2019-11-15 | Discharge: 2019-11-15 | Disposition: A | Discharge status: Home / Self Care | Payer: 59 | Source: Home / Self Care

## 2019-11-15 DIAGNOSIS — R0981 Nasal congestion: Secondary | ICD-10-CM

## 2019-11-15 DIAGNOSIS — J029 Acute pharyngitis, unspecified: Secondary | ICD-10-CM

## 2019-11-15 MED ORDER — IPRATROPIUM BROMIDE 0.06 % NA SOLN: 2.0000 | Freq: Four times a day (QID) | NASAL | 1 refills | Status: AC

## 2019-11-15 NOTE — ED Triage Notes (Signed)
Patient presents to Urgent Care with complaints of sore throat and nasal congestion since last night. Patient reports her husband was sick recently as well.

## 2019-11-15 NOTE — ED Provider Notes (Signed)
[facility]    CSN: [id number] Arrival date & time: 11/15/19  1235      History   Chief Complaint Chief Complaint  Patient presents with  . Sore Throat  . Nasal Congestion    HPI Debbie Cantrell is a 39 y.o. female.   HPI  Debbie Cantrell is a 39 y.o. female presenting to UC with c/o sore throat and nasal congestion that started last night.  She reports her husband has been sick recently as well. Pt has not received th Covid vaccine and would like to be tested today. Denies fever, chills, n/v/d. No chest pain or SOB.    Past Medical History:  Diagnosis Date  . Allergy   . Anemia   . Asthma     Patient Active Problem List   Diagnosis Date Noted  . Right foot pain 01/27/2012    Past Surgical History:  Procedure Laterality Date  . CESAREAN SECTION    . HAND CARPECTOMY     right in 2015 with torn tendon    OB History   No obstetric history on file.      Home Medications    Prior to Admission medications   Medication Sig Start Date End Date Taking? Authorizing Provider  ipratropium (ATROVENT) 0.06 % nasal spray Place 2 sprays into both nostrils 4 (four) times daily. 11/15/19   Lurene Shadow, PA-C    Family History Family History  Problem Relation Age of Onset  . Hypertension Father   . Diabetes Father   . Hypertension Mother   . Hypertension Brother   . Hyperlipidemia Neg Hx   . Heart attack Neg Hx   . Sudden death Neg Hx     Social History Social History   Tobacco Use  . Smoking status: Former Games developer  . Smokeless tobacco: Never Used  Substance Use Topics  . Alcohol use: No  . Drug use: No     Allergies   Patient has no known allergies.   Review of Systems Review of Systems  Constitutional: Negative for chills and fever.  HENT: Positive for congestion and sore throat. Negative for ear pain, postnasal drip, rhinorrhea, trouble swallowing and voice change.   Respiratory: Negative for cough and shortness of breath.     Cardiovascular: Negative for chest pain and palpitations.  Gastrointestinal: Negative for abdominal pain, diarrhea, nausea and vomiting.  Musculoskeletal: Negative for arthralgias, back pain and myalgias.  Skin: Negative for rash.  Neurological: Positive for headaches (mild frontal). Negative for dizziness and light-headedness.  All other systems reviewed and are negative.    Physical Exam Triage Vital Signs ED Triage Vitals  Enc Vitals Group     BP 11/15/19 1346 114/75     Pulse Rate 11/15/19 1346 70     Resp 11/15/19 1346 16     Temp 11/15/19 1346 97.8 F (36.6 C)     Temp Source 11/15/19 1346 Oral     SpO2 11/15/19 1346 99 %     Weight --      Height --      Head Circumference --      Peak Flow --      Pain Score 11/15/19 1345 5     Pain Loc --      Pain Edu? --      Excl. in GC? --    No data found.  Updated Vital Signs BP 114/75 (BP Location: Left Arm)   Pulse 70   Temp 97.8 F (  36.6 C) (Oral)   Resp 16   SpO2 99%   Visual Acuity Right Eye Distance:   Left Eye Distance:   Bilateral Distance:    Right Eye Near:   Left Eye Near:    Bilateral Near:     Physical Exam Vitals and nursing note reviewed.  Constitutional:      General: She is not in acute distress.    Appearance: She is well-developed. She is not ill-appearing, toxic-appearing or diaphoretic.  HENT:     Head: Normocephalic and atraumatic.     Right Ear: Tympanic membrane and ear canal normal.     Left Ear: Tympanic membrane and ear canal normal.     Nose: Nose normal.     Right Sinus: No maxillary sinus tenderness or frontal sinus tenderness.     Left Sinus: No maxillary sinus tenderness or frontal sinus tenderness.     Mouth/Throat:     Lips: Pink.     Mouth: Mucous membranes are moist.     Pharynx: Oropharynx is clear. Uvula midline.  Cardiovascular:     Rate and Rhythm: Normal rate and regular rhythm.  Pulmonary:     Effort: Pulmonary effort is normal. No respiratory distress.      Breath sounds: Normal breath sounds. No stridor. No wheezing, rhonchi or rales.  Musculoskeletal:        General: Normal range of motion.     Cervical back: Normal range of motion.  Skin:    General: Skin is warm and dry.  Neurological:     Mental Status: She is alert and oriented to person, place, and time.  Psychiatric:        Behavior: Behavior normal.      UC Treatments / Results  Labs (all labs ordered are listed, but only abnormal results are displayed) Labs Reviewed  SARS-COV-2 RNA,(COVID-19) QUALITATIVE NAAT    EKG   Radiology No results found.  Procedures Procedures (including critical care time)  Medications Ordered in UC Medications - No data to display  Initial Impression / Assessment and Plan / UC Course  I have reviewed the triage vital signs and the nursing notes.  Pertinent labs & imaging results that were available during my care of the patient were reviewed by me and considered in my medical decision making (see chart for details).     Hx and exam c/w viral illness No evidence of bacterial infection at this time. Covid pending F/u PCP 1 week AVS provided  Final Clinical Impressions(s) / UC Diagnoses   Final diagnoses:  Nasal congestion  Sore throat     Discharge Instructions      You may take 500mg  acetaminophen every 4-6 hours or in combination with ibuprofen 400-600mg  every 6-8 hours as needed for pain, inflammation, and fever.  Be sure to well hydrated with clear liquids and get at least 8 hours of sleep at night, preferably more while sick.   Please follow up with family medicine in 1 week if needed.     ED Prescriptions    Medication Sig Dispense Auth. Provider   ipratropium (ATROVENT) 0.06 % nasal spray Place 2 sprays into both nostrils 4 (four) times daily. 15 mL , PA-C     PDMP not reviewed this encounter.   Lurene Shadow, Lurene Shadow 11/15/19 [phone]

## 2019-11-15 NOTE — Discharge Instructions (Signed)
  You may take 500mg acetaminophen every 4-6 hours or in combination with ibuprofen 400-600mg every 6-8 hours as needed for pain, inflammation, and fever.  Be sure to well hydrated with clear liquids and get at least 8 hours of sleep at night, preferably more while sick.   Please follow up with family medicine in 1 week if needed.   

## 2019-11-16 LAB — SARS-COV-2 RNA,(COVID-19) QUALITATIVE NAAT: SARS CoV2 RNA: NOT DETECTED

## 2021-04-02 ENCOUNTER — Emergency Department (HOSPITAL_BASED_OUTPATIENT_CLINIC_OR_DEPARTMENT_OTHER)
Admission: EM | Admit: 2021-04-02 | Discharge: 2021-04-02 | Disposition: A | Discharge status: Home / Self Care | Payer: BC Managed Care – PPO | Attending: Emergency Medicine | Admitting: Emergency Medicine

## 2021-04-02 ENCOUNTER — Other Ambulatory Visit: Payer: Self-pay

## 2021-04-02 ENCOUNTER — Emergency Department (HOSPITAL_BASED_OUTPATIENT_CLINIC_OR_DEPARTMENT_OTHER): Payer: BC Managed Care – PPO

## 2021-04-02 ENCOUNTER — Encounter (HOSPITAL_BASED_OUTPATIENT_CLINIC_OR_DEPARTMENT_OTHER): Payer: Self-pay | Admitting: *Deleted

## 2021-04-02 DIAGNOSIS — J45909 Unspecified asthma, uncomplicated: Secondary | ICD-10-CM | POA: Insufficient documentation

## 2021-04-02 DIAGNOSIS — R0602 Shortness of breath: Secondary | ICD-10-CM | POA: Diagnosis not present

## 2021-04-02 DIAGNOSIS — Z87891 Personal history of nicotine dependence: Secondary | ICD-10-CM | POA: Insufficient documentation

## 2021-04-02 DIAGNOSIS — R079 Chest pain, unspecified: Secondary | ICD-10-CM | POA: Insufficient documentation

## 2021-04-02 LAB — BASIC METABOLIC PANEL
Anion gap: 9 (ref 5–15)
BUN: 10 mg/dL (ref 6–20)
CO2: 24 mmol/L (ref 22–32)
Calcium: 8.9 mg/dL (ref 8.9–10.3)
Chloride: 105 mmol/L (ref 98–111)
Creatinine, Ser: 0.96 mg/dL (ref 0.44–1.00)
GFR, Estimated: >60 mL/min (ref >60)
Glucose, Bld: 97 mg/dL (ref 70–99)
Potassium: 3.9 mmol/L (ref 3.5–5.1)
Sodium: 138 mmol/L (ref 135–145)

## 2021-04-02 LAB — D-DIMER, QUANTITATIVE: D-Dimer, Quant: 1.63 ug/mL-FEU — ABNORMAL HIGH (ref 0.00–0.50)

## 2021-04-02 LAB — CBC
HCT: 31.3 % — ABNORMAL LOW (ref 36.0–46.0)
Hemoglobin: 9.7 g/dL — ABNORMAL LOW (ref 12.0–15.0)
MCH: 23.4 pg — ABNORMAL LOW (ref 26.0–34.0)
MCHC: 31 g/dL (ref 30.0–36.0)
MCV: 75.6 fL — ABNORMAL LOW (ref 80.0–100.0)
Platelets: 389 10*3/uL (ref 150–400)
RBC: 4.14 MIL/uL (ref 3.87–5.11)
RDW: 18.4 % — ABNORMAL HIGH (ref 11.5–15.5)
WBC: 5.5 10*3/uL (ref 4.0–10.5)
nRBC: 0 % (ref 0.0–0.2)

## 2021-04-02 LAB — TROPONIN I (HIGH SENSITIVITY)
Troponin I (High Sensitivity): <2 ng/L (ref <18)
Troponin I (High Sensitivity): 2 ng/L (ref <18)

## 2021-04-02 LAB — PREGNANCY, URINE: Preg Test, Ur: NEGATIVE

## 2021-04-02 MED ORDER — ASPIRIN 325 MG PO TABS
325.0000 mg | ORAL_TABLET | Freq: Every day | ORAL | Status: DC
  Administered 2021-04-02: 325 mg via ORAL
  Filled 2021-04-02: qty 1

## 2021-04-02 MED ORDER — IOHEXOL 350 MG/ML SOLN
100.0000 mL | Freq: Once | INTRAVENOUS | Status: AC | PRN
  Administered 2021-04-02: 100 mL via INTRAVENOUS

## 2021-04-02 NOTE — Discharge Instructions (Signed)
Follow-up with your primary care doctor this week or next week the pain is still going on.   Take Tylenol Motrin as needed for pain. Return to ED if the pain changes or worsens in intensity.

## 2021-04-02 NOTE — ED Triage Notes (Signed)
Chest pain today. Back pain. EKG at triage.

## 2021-04-02 NOTE — ED Provider Notes (Signed)
[facility] EMERGENCY DEPARTMENT Provider Note   CSN: [id number] Arrival date & time: 04/02/21  1251     History Chief Complaint  Patient presents with   Chest Pain    Debbie Cantrell is a 40 y.o. female.  HPI  Patient with history of anemia and asthma presents due to chest pain.  It started acutely this morning, initially resolved and then started again.  Is been constant since it restarted.  It feels a sharp pain on the left side of her chest that occasionally radiates to her back.  She does have some associated shortness of breath occasionally, no nausea or vomiting.  Patient does not smoke cigarettes, no history of hypertension, hyperlipidemia, diabetes.  No history of blood clots or MI.  She did have a hysterectomy 2 months ago, denies any other surgeries or recent travel.  No associated viral symptoms.  Has seen a cardiologist previously for palpitations, denies feeling any pain similar to this.  Past Medical History:  Diagnosis Date   Allergy    Anemia    Asthma     Patient Active Problem List   Diagnosis Date Noted   Right foot pain 01/27/2012    Past Surgical History:  Procedure Laterality Date   CESAREAN SECTION     HAND CARPECTOMY     right in 2015 with torn tendon     OB History   No obstetric history on file.     Family History  Problem Relation Age of Onset   Hypertension Father    Diabetes Father    Hypertension Mother    Hypertension Brother    Hyperlipidemia Neg Hx    Heart attack Neg Hx    Sudden death Neg Hx     Social History   Tobacco Use   Smoking status: Former   Smokeless tobacco: Never  Substance Use Topics   Alcohol use: No   Drug use: No    Home Medications Prior to Admission medications   Medication Sig Start Date End Date Taking? Authorizing Provider  ipratropium (ATROVENT) 0.06 % nasal spray Place 2 sprays into both nostrils 4 (four) times daily. 11/15/19   Lurene Shadow, PA-C    Allergies    Patient has  no known allergies.  Review of Systems   Review of Systems  Constitutional:  Negative for fever.  Respiratory:  Positive for shortness of breath.   Cardiovascular:  Positive for chest pain. Negative for leg swelling.  Gastrointestinal:  Negative for nausea and vomiting.  Musculoskeletal:  Positive for back pain.  Skin:  Negative for wound.  Neurological:  Negative for syncope.   Physical Exam Updated Vital Signs BP 124/84   Pulse 66   Temp 98.3 F (36.8 C) (Oral)   Resp 17   Ht 5\' 2"  (1.575 m)   Wt 80.3 kg   LMP 04/20/2018   SpO2 100%   BMI 32.37 kg/m   Physical Exam Vitals and nursing note reviewed. Exam conducted with a chaperone present.  Constitutional:      Appearance: Normal appearance.  HENT:     Head: Normocephalic and atraumatic.  Eyes:     General: No scleral icterus.       Right eye: No discharge.        Left eye: No discharge.     Extraocular Movements: Extraocular movements intact.     Pupils: Pupils are equal, round, and reactive to light.  Cardiovascular:     Rate and Rhythm: Normal rate  and regular rhythm.     Pulses: Normal pulses.     Heart sounds: Normal heart sounds. No murmur heard.   No friction rub. No gallop.     Comments: S1S2, no murmurs. Radial pulse 2+ bilat Pulmonary:     Effort: Pulmonary effort is normal. No respiratory distress.     Breath sounds: Normal breath sounds.  Abdominal:     General: Abdomen is flat. Bowel sounds are normal. There is no distension.     Palpations: Abdomen is soft.     Tenderness: There is no abdominal tenderness.  Skin:    General: Skin is warm and dry.     Coloration: Skin is not jaundiced.  Neurological:     Mental Status: She is alert. Mental status is at baseline.     Coordination: Coordination normal.   ED Results / Procedures / Treatments   Labs (all labs ordered are listed, but only abnormal results are displayed) Labs Reviewed  CBC - Abnormal; Notable for the following components:       Result Value   Hemoglobin 9.7 (*)    HCT 31.3 (*)    MCV 75.6 (*)    MCH 23.4 (*)    RDW 18.4 (*)    All other components within normal limits  D-DIMER, QUANTITATIVE - Abnormal; Notable for the following components:   D-Dimer, Quant 1.63 (*)    All other components within normal limits  BASIC METABOLIC PANEL  PREGNANCY, URINE  TROPONIN I (HIGH SENSITIVITY)    EKG None  Radiology No results found.  Procedures Procedures   Medications Ordered in ED Medications - No data to display  ED Course  I have reviewed the triage vital signs and the nursing notes.  Pertinent labs & imaging results that were available during my care of the patient were reviewed by me and considered in my medical decision making (see chart for details).    MDM Rules/Calculators/A&P                           Stable vitals, nontoxic-appearing.  Tachycardia resolved, chest pain is improved.  Chest x-ray negative for any pneumonia, negative delta troponin.  EKG without any signs of arrhythmia or ST elevation concerning for ACS.  CTA ordered due to elevated dimer, negative for any pulmonary embolism.  Chest pain improved with rest.  Vitals remained stable without any hypoxia, not think she requires any additional work-up at this time.  Patient discharged in stable condition.  Final Clinical Impression(s) / ED Diagnoses Final diagnoses:  None    Rx / DC Orders ED Discharge Orders     None        Theron Arista, Cordelia Poche 04/02/21 1947    Benjiman Core, MD 04/03/21 [phone]

## 2021-04-02 NOTE — ED Notes (Signed)
Patient transported to CT
